# Patient Record
Sex: Female | Born: 1985 | Race: White | Hispanic: No | Marital: Single | Smoking: Current every day smoker
Health system: Southern US, Community
[De-identification: ages and names within clinical notes are randomized; demographics above are authoritative.]

---

## 2006-07-31 ENCOUNTER — Ambulatory Visit (HOSPITAL_COMMUNITY): Admission: RE | Admit: 2006-07-31 | Discharge: 2006-07-31 | Payer: Self-pay | Admitting: Obstetrics and Gynecology

## 2006-08-21 ENCOUNTER — Ambulatory Visit (HOSPITAL_COMMUNITY): Admission: RE | Admit: 2006-08-21 | Discharge: 2006-08-21 | Payer: Self-pay | Admitting: Obstetrics and Gynecology

## 2011-04-18 ENCOUNTER — Emergency Department (INDEPENDENT_AMBULATORY_CARE_PROVIDER_SITE_OTHER): Payer: Self-pay

## 2011-04-18 ENCOUNTER — Emergency Department (HOSPITAL_BASED_OUTPATIENT_CLINIC_OR_DEPARTMENT_OTHER)
Admission: EM | Admit: 2011-04-18 | Discharge: 2011-04-18 | Disposition: A | Discharge status: Home / Self Care | Payer: Self-pay | Attending: Emergency Medicine | Admitting: Emergency Medicine

## 2011-04-18 ENCOUNTER — Encounter: Payer: Self-pay | Admitting: Family Medicine

## 2011-04-18 DIAGNOSIS — O2690 Pregnancy related conditions, unspecified, unspecified trimester: Secondary | ICD-10-CM | POA: Insufficient documentation

## 2011-04-18 DIAGNOSIS — X500XXA Overexertion from strenuous movement or load, initial encounter: Secondary | ICD-10-CM

## 2011-04-18 DIAGNOSIS — F17200 Nicotine dependence, unspecified, uncomplicated: Secondary | ICD-10-CM | POA: Insufficient documentation

## 2011-04-18 DIAGNOSIS — M25579 Pain in unspecified ankle and joints of unspecified foot: Secondary | ICD-10-CM

## 2011-04-18 DIAGNOSIS — R609 Edema, unspecified: Secondary | ICD-10-CM

## 2011-04-18 MED ORDER — ACETAMINOPHEN 325 MG PO TABS
650.0000 mg | ORAL_TABLET | Freq: Once | ORAL | Status: AC
  Administered 2011-04-18: 650 mg via ORAL
  Filled 2011-04-18: qty 2
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
## 2011-04-18 NOTE — ED Notes (Signed)
Report given to Laura, RN.

## 2011-04-18 NOTE — ED Notes (Signed)
Pt sts she is pregnant but unsure if she is "2 or 3 months". Pt sts she has 4 kids and this is 5th pregnancy.
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
## 2014-06-23 ENCOUNTER — Encounter: Payer: Self-pay | Admitting: Family Medicine

## 2016-11-08 DIAGNOSIS — O99322 Drug use complicating pregnancy, second trimester: Secondary | ICD-10-CM | POA: Diagnosis not present

## 2016-11-08 DIAGNOSIS — R45851 Suicidal ideations: Secondary | ICD-10-CM | POA: Diagnosis not present

## 2016-11-08 DIAGNOSIS — F17210 Nicotine dependence, cigarettes, uncomplicated: Secondary | ICD-10-CM | POA: Diagnosis not present

## 2016-11-08 DIAGNOSIS — Y92410 Unspecified street and highway as the place of occurrence of the external cause: Secondary | ICD-10-CM | POA: Insufficient documentation

## 2016-11-08 DIAGNOSIS — Y9389 Activity, other specified: Secondary | ICD-10-CM | POA: Insufficient documentation

## 2016-11-08 DIAGNOSIS — F1110 Opioid abuse, uncomplicated: Secondary | ICD-10-CM | POA: Insufficient documentation

## 2016-11-08 DIAGNOSIS — Y999 Unspecified external cause status: Secondary | ICD-10-CM | POA: Insufficient documentation

## 2016-11-08 DIAGNOSIS — S8251XA Displaced fracture of medial malleolus of right tibia, initial encounter for closed fracture: Secondary | ICD-10-CM | POA: Diagnosis not present

## 2016-11-08 DIAGNOSIS — Z3A20 20 weeks gestation of pregnancy: Secondary | ICD-10-CM | POA: Diagnosis not present

## 2016-11-08 DIAGNOSIS — O9A212 Injury, poisoning and certain other consequences of external causes complicating pregnancy, second trimester: Secondary | ICD-10-CM | POA: Diagnosis present

## 2016-11-08 DIAGNOSIS — Z79899 Other long term (current) drug therapy: Secondary | ICD-10-CM | POA: Insufficient documentation

## 2016-11-08 DIAGNOSIS — O99332 Smoking (tobacco) complicating pregnancy, second trimester: Secondary | ICD-10-CM | POA: Diagnosis not present

## 2016-11-08 DIAGNOSIS — O99342 Other mental disorders complicating pregnancy, second trimester: Secondary | ICD-10-CM | POA: Diagnosis not present

## 2016-11-09 ENCOUNTER — Encounter (HOSPITAL_COMMUNITY): Payer: Self-pay | Admitting: Emergency Medicine

## 2016-11-09 ENCOUNTER — Emergency Department (HOSPITAL_COMMUNITY): Payer: Medicaid Other

## 2016-11-09 ENCOUNTER — Emergency Department (HOSPITAL_COMMUNITY)
Admission: EM | Admit: 2016-11-09 | Discharge: 2016-11-10 | Disposition: A | Discharge status: Home / Self Care | Payer: Medicaid Other | Attending: Emergency Medicine | Admitting: Emergency Medicine

## 2016-11-09 DIAGNOSIS — R45851 Suicidal ideations: Secondary | ICD-10-CM

## 2016-11-09 DIAGNOSIS — S82891A Other fracture of right lower leg, initial encounter for closed fracture: Secondary | ICD-10-CM

## 2016-11-09 DIAGNOSIS — F1910 Other psychoactive substance abuse, uncomplicated: Secondary | ICD-10-CM

## 2016-11-09 DIAGNOSIS — Z3490 Encounter for supervision of normal pregnancy, unspecified, unspecified trimester: Secondary | ICD-10-CM

## 2016-11-09 LAB — COMPREHENSIVE METABOLIC PANEL
ALBUMIN: 2.9 g/dL — AB (ref 3.5–5.0)
ALK PHOS: 97 U/L (ref 38–126)
ALT: 18 U/L (ref 14–54)
AST: 22 U/L (ref 15–41)
Anion gap: 12 (ref 5–15)
BUN: 6 mg/dL (ref 6–20)
CALCIUM: 9.1 mg/dL (ref 8.9–10.3)
CO2: 21 mmol/L — AB (ref 22–32)
CREATININE: 0.55 mg/dL (ref 0.44–1.00)
Chloride: 104 mmol/L (ref 101–111)
GFR calc Af Amer: >60 mL/min (ref >60)
GFR calc non Af Amer: >60 mL/min (ref >60)
GLUCOSE: 114 mg/dL — AB (ref 65–99)
Potassium: 3.6 mmol/L (ref 3.5–5.1)
SODIUM: 137 mmol/L (ref 135–145)
Total Bilirubin: 0.5 mg/dL (ref 0.3–1.2)
Total Protein: 6.3 g/dL — ABNORMAL LOW (ref 6.5–8.1)

## 2016-11-09 LAB — ETHANOL: Alcohol, Ethyl (B): <5 mg/dL (ref <5)

## 2016-11-09 LAB — RAPID URINE DRUG SCREEN, HOSP PERFORMED
Amphetamines: NOT DETECTED
BARBITURATES: NOT DETECTED
Benzodiazepines: NOT DETECTED
COCAINE: NOT DETECTED
Opiates: NOT DETECTED
TETRAHYDROCANNABINOL: POSITIVE — AB

## 2016-11-09 LAB — CBC
HEMATOCRIT: 36.2 % (ref 36.0–46.0)
Hemoglobin: 12.6 g/dL (ref 12.0–15.0)
MCH: 31.9 pg (ref 26.0–34.0)
MCHC: 34.8 g/dL (ref 30.0–36.0)
MCV: 91.6 fL (ref 78.0–100.0)
PLATELETS: 246 10*3/uL (ref 150–400)
RBC: 3.95 MIL/uL (ref 3.87–5.11)
RDW: 14.3 % (ref 11.5–15.5)
WBC: 11.8 10*3/uL — ABNORMAL HIGH (ref 4.0–10.5)

## 2016-11-09 LAB — ACETAMINOPHEN LEVEL: Acetaminophen (Tylenol), Serum: <10 ug/mL — ABNORMAL LOW (ref 10–30)

## 2016-11-09 LAB — SALICYLATE LEVEL

## 2016-11-09 MED ORDER — NICOTINE 21 MG/24HR TD PT24: 21.0000 mg | MEDICATED_PATCH | Freq: Once | TRANSDERMAL | Status: DC

## 2016-11-09 MED ORDER — PRENATAL 27-0.8 MG PO TABS
1.0000 | ORAL_TABLET | Freq: Every day | ORAL | Status: DC
  Administered 2016-11-09: 1 via ORAL
  Filled 2016-11-09 (×3): qty 1

## 2016-11-09 MED ORDER — HYDROXYZINE PAMOATE 50 MG PO CAPS: 100.0000 mg | ORAL_CAPSULE | Freq: Three times a day (TID) | ORAL | Status: DC | PRN

## 2016-11-09 MED ORDER — ACETAMINOPHEN 325 MG PO TABS
650.0000 mg | ORAL_TABLET | ORAL | Status: DC | PRN
  Administered 2016-11-09 – 2016-11-10 (×2): 650 mg via ORAL
  Filled 2016-11-09 (×2): qty 2

## 2016-11-09 MED ORDER — ACETAMINOPHEN 500 MG PO TABS: 1000.0000 mg | ORAL_TABLET | Freq: Four times a day (QID) | ORAL | Status: DC | PRN

## 2016-11-09 MED ORDER — NICOTINE 21 MG/24HR TD PT24
21.0000 mg | MEDICATED_PATCH | Freq: Every day | TRANSDERMAL | Status: DC
  Filled 2016-11-09: qty 1
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
## 2016-11-09 NOTE — Progress Notes (Signed)
Orthopedic Tech Progress Note Patient Details:  Meredith Miller [birth year] [id number]  Ortho Devices Type of Ortho Device: Ace wrap, Crutches, Post (short leg) splint, Stirrup splint Ortho Device/Splint Location: rle Ortho Device/Splint Interventions: Application   Jaquese Irving 11/09/2016, 9:25 AM

## 2016-11-09 NOTE — ED Notes (Signed)
Clean linen placed on bed and warm blanket given
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
## 2016-11-09 NOTE — ED Notes (Signed)
Pt requested saltines and sprite for snack. Pt given the same

## 2016-11-09 NOTE — Progress Notes (Signed)
CSW contacted the [place] [employer] in order to get information on residential perinatal substance abuse treatment programs for patient.  CSW spoke to Dolores Pattyeana Roberts and left a message for Lindell NoeJudith Johnson-Hostler, who is the Perinatal Substance Abuse Specialist for the state.   CSW contacted [facility] CSW to make her aware of efforts to contact perinatal SA programs and to ask her to inquire if pt is only interested in the programs near [place] as she had indicated earlier that she was interested in going to the  [facility], which does not treat perinatal SA.    [facility] CSW spoke with pt and relayed to this writer that patient wanted only "30 days" of treatment.  Perinatal residential programs are 1-2 years in duration.  CS[facility], and [facility].  Timmothy EulerJean T. Kaylyn LimSutter, MSW, LCSWA Clinical Social Work Disposition [phone]

## 2016-11-09 NOTE — ED Provider Notes (Signed)
A shot resting in bed pleasant cooperative alert and in no distress. Requesting nicotine patch however I explained to her that she is pregnant and there is a risk to the fetus with nicotine patch. She is in agreement not to forego nicotine patch presently   Doug SouSam Ziad Maye, MD 11/09/16 1454
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
## 2016-11-09 NOTE — ED Notes (Signed)
Due to pt having a cast on her ankle she is given a wash basin to wash off in her room. She is given a fresh change of scrubs and socks. Shampoo, tooth brush, tooth paste, antiperspirant and underware

## 2016-11-09 NOTE — BH Assessment (Signed)
Tele Assessment Note   Meredith Miller is an 31 y.o. female. Pt reports SI with a plan. Pt denies HI and AVH. Pt states she is 5 months pregnant. Pt reports ongoing drug use. Pt states she is using opiates, marijuana, and cocaine daily. Pt reports previous hospitalizations for depression and SI. Pt states she was recently D/C from [facility] for depression and SI. Pt states she was suppose to follow-up with [facility] but she has not had a chance. Pt states she does not feel comfortable returning home because of her drug use. Pt states she would like to enter into a drug rehabilitation program. Pt denies abuse.   Per Jacki Cones, NP Pt meets inpatient criteria. TTS to seek placement.   Diagnosis:  F33.2 MDD  Past Medical History: History reviewed. No pertinent past medical history.  History reviewed. No pertinent surgical history.  Family History: History reviewed. No pertinent family history.  Social History:  reports that she has been smoking Cigarettes.  She has been smoking about 0.50 packs per day. She does not have any smokeless tobacco history on file. She reports that she uses drugs, including Marijuana. She reports that she does not drink alcohol.  Additional Social History:  Alcohol / Drug Use Pain Medications: please see mar Prescriptions: please see mar Over the Counter: please see mar History of alcohol / drug use?: Yes Longest period of sobriety (when/how long): unknown Substance #1 Name of Substance 1: percocet/heroin 1 - Age of First Use: unknown 1 - Amount (size/oz): $20-$40 1 - Frequency: daily 1 - Duration: ongoing 1 - Last Use / Amount: 11/07/16 Substance #2 Name of Substance 2: marijuana 2 - Age of First Use: unknown 2 - Amount (size/oz): 2 blunts 2 - Frequency: daily 2 - Duration: ongoing 2 - Last Use / Amount: 11/07/16 Substance #3 Name of Substance 3: cocaine 3 - Age of First Use: unknown 3 - Amount (size/oz): $20 3 - Frequency: occasional 3 - Duration:  ongoing 3 - Last Use / Amount: 2 weeks ago  CIWA: CIWA-Ar BP: 116/75 Pulse Rate: 77 COWS:    PATIENT STRENGTHS: (choose at least two) Average or above average intelligence Communication skills  Allergies:  Allergies  Allergen Reactions  . Penicillins     Doesn't work    Home Medications:  (Not in a hospital admission)  OB/GYN Status:  No LMP recorded. Patient is pregnant.  General Assessment Data Location of Assessment: [facility] ED TTS Assessment: In system Is this a Tele or Face-to-Face Assessment?: Face-to-Face Is this an Initial Assessment or a Re-assessment for this encounter?: Initial Assessment Marital status: Single Maiden name: NA Is patient pregnant?: Yes Pregnancy Status: Yes (Comment: include estimated delivery date) (5 months ) Living Arrangements: Spouse/significant other Can pt return to current living arrangement?: Yes Admission Status: Voluntary Is patient capable of signing voluntary admission?: Yes Referral Source: Self/Family/Friend Insurance type: Medicaid     Crisis Care Plan Living Arrangements: Spouse/significant other Legal Guardian: Other: (self) Name of Psychiatrist: [facility] of Therapist: [facility]  Education Status Is patient currently in school?: No Current Grade: NA Highest grade of school patient has completed: 12 Name of school: NA Contact person: NA  Risk to self with the past 6 months Suicidal Ideation: No-Not Currently/Within Last 6 Months Has patient been a risk to self within the past 6 months prior to admission? : No Suicidal Intent: No-Not Currently/Within Last 6 Months Has patient had any suicidal intent within the past 6 months prior to admission? :  No Is patient at risk for suicide?: No Suicidal Plan?: No Has patient had any suicidal plan within the past 6 months prior to admission? : No Access to Means: No What has been your use of drugs/alcohol within the last 12 months?: opiate, marijuana, cocaine Previous  Attempts/Gestures: No How many times?: 0 Other Self Harm Risks: NA Triggers for Past Attempts: None known Intentional Self Injurious Behavior: None Family Suicide History: No Recent stressful life event(s): Other (Comment) Persecutory voices/beliefs?: No Depression: Yes Depression Symptoms: Despondent, Tearfulness, Isolating, Fatigue, Loss of interest in usual pleasures, Feeling worthless/self pity, Feeling angry/irritable Substance abuse history and/or treatment for substance abuse?: Yes Suicide prevention information given to non-admitted patients: Not applicable  Risk to Others within the past 6 months Homicidal Ideation: No Does patient have any lifetime risk of violence toward others beyond the six months prior to admission? : No Thoughts of Harm to Others: No Current Homicidal Intent: No Current Homicidal Plan: No Access to Homicidal Means: No Identified Victim: NA History of harm to others?: No Assessment of Violence: None Noted Violent Behavior Description: NA Does patient have access to weapons?: No Criminal Charges Pending?: No Does patient have a court date: No Is patient on probation?: No  Psychosis Hallucinations: None noted Delusions: None noted  Mental Status Report Appearance/Hygiene: Unremarkable Eye Contact: Fair Motor Activity: Freedom of movement Speech: Logical/coherent Level of Consciousness: Alert Mood: Depressed Affect: Depressed Anxiety Level: Moderate Thought Processes: Coherent, Relevant Judgement: Unimpaired Orientation: Person, Place, Time, Situation, Appropriate for developmental age Obsessive Compulsive Thoughts/Behaviors: None  Cognitive Functioning Concentration: Normal Memory: Recent Intact, Remote Intact IQ: Average Insight: Fair Impulse Control: Fair Appetite: Fair Weight Loss: 0 Weight Gain: 0 Sleep: Decreased Total Hours of Sleep: 5 Vegetative Symptoms: None  ADLScreening [facility] Assessment Services) Patient's cognitive  ability adequate to safely complete daily activities?: Yes Patient able to express need for assistance with ADLs?: Yes Independently performs ADLs?: Yes (appropriate for developmental age)  Prior Inpatient Therapy Prior Inpatient Therapy: Yes Prior Therapy Dates: 2018 Prior Therapy Facilty/Provider(s): [facility] Reason for Treatment: depression, SI  Prior Outpatient Therapy Prior Outpatient Therapy: No Prior Therapy Dates: NA Prior Therapy Facilty/Provider(s): nA Reason for Treatment: nA Does patient have an ACCT team?: No Does patient have Intensive In-House Services?  : No Does patient have [facility] services? : No Does patient have P4CC services?: No  ADL Screening (condition at time of admission) Patient's cognitive ability adequate to safely complete daily activities?: Yes Is the patient deaf or have difficulty hearing?: No Does the patient have difficulty seeing, even when wearing glasses/contacts?: No Does the patient have difficulty concentrating, remembering, or making decisions?: No Patient able to express need for assistance with ADLs?: Yes Does the patient have difficulty dressing or bathing?: No Independently performs ADLs?: Yes (appropriate for developmental age) Does the patient have difficulty walking or climbing stairs?: No Weakness of Legs: None Weakness of Arms/Hands: None       Abuse/Neglect Assessment (Assessment to be complete while patient is alone) Physical Abuse: Denies Verbal Abuse: Denies Sexual Abuse: Denies Exploitation of patient/patient's resources: Denies Self-Neglect: Denies     Merchant navy officerAdvance Directives (For Healthcare) Does Patient Have a Medical Advance Directive?: No    Additional Information 1:1 In Past 12 Months?: No CIRT Risk: No Elopement Risk: No Does patient have medical clearance?: Yes     Disposition:  Disposition Initial Assessment Completed for this Encounter: Yes  Keltin Baird D 11/09/2016 10:56 AM

## 2016-11-10 LAB — POC URINE PREG, ED: Preg Test, Ur: POSITIVE — AB

## 2016-11-10 MED ORDER — PRENATAL MULTIVITAMIN CH
1.0000 | ORAL_TABLET | Freq: Every day | ORAL | Status: DC
  Administered 2016-11-10: 1 via ORAL

## 2016-11-10 NOTE — BH Assessment (Signed)
Re-Assessment   Patient is a 31 year old white female that denies SI/HI/Psychosis.  Patient reports a past histoy of substance abuse.  Patient is 5 months pregnant.   Patient reports the when she came to the ED she may have made some, "statements that were not accurate".   Patient states that she was feeling emotional due to the pregnancy.   Patient reports that [facility] has [facility] and she is able to go to their walk in clinic today to have her psychiatric medication adjusted    Per Jacki ConesLaurie, NP - patient does not meet criteria for inpatient hospitalization.  Writer informed the ER RN.

## 2016-11-10 NOTE — ED Notes (Signed)
A Regular Diet has Been Ordered for [employer].

## 2016-11-10 NOTE — ED Notes (Signed)
Patient was given a snack and drink, and A Regular Diet was taken for Lunch. 
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
## 2018-03-09 ENCOUNTER — Other Ambulatory Visit: Payer: Self-pay

## 2018-03-09 ENCOUNTER — Encounter (HOSPITAL_COMMUNITY): Payer: Self-pay | Admitting: Emergency Medicine

## 2018-03-09 ENCOUNTER — Emergency Department (HOSPITAL_COMMUNITY)
Admission: EM | Admit: 2018-03-09 | Discharge: 2018-03-09 | Disposition: A | Discharge status: Home / Self Care | Payer: Medicaid Other | Attending: Emergency Medicine | Admitting: Emergency Medicine

## 2018-03-09 DIAGNOSIS — F418 Other specified anxiety disorders: Secondary | ICD-10-CM | POA: Diagnosis present

## 2018-03-09 DIAGNOSIS — Z5321 Procedure and treatment not carried out due to patient leaving prior to being seen by health care provider: Secondary | ICD-10-CM | POA: Insufficient documentation

## 2018-03-09 LAB — RAPID URINE DRUG SCREEN, HOSP PERFORMED
AMPHETAMINES: POSITIVE — AB
Benzodiazepines: NOT DETECTED
Cocaine: POSITIVE — AB
OPIATES: POSITIVE — AB
TETRAHYDROCANNABINOL: POSITIVE — AB

## 2018-03-09 LAB — COMPREHENSIVE METABOLIC PANEL
ALK PHOS: 74 U/L (ref 38–126)
ALT: 31 U/L (ref 0–44)
AST: 30 U/L (ref 15–41)
Albumin: 3.8 g/dL (ref 3.5–5.0)
Anion gap: 10 (ref 5–15)
BILIRUBIN TOTAL: 0.5 mg/dL (ref 0.3–1.2)
BUN: 8 mg/dL (ref 6–20)
CALCIUM: 9.4 mg/dL (ref 8.9–10.3)
CO2: 25 mmol/L (ref 22–32)
Chloride: 107 mmol/L (ref 98–111)
Creatinine, Ser: 1.01 mg/dL — ABNORMAL HIGH (ref 0.44–1.00)
GFR calc Af Amer: >60 mL/min (ref >60)
GFR calc non Af Amer: >60 mL/min (ref >60)
GLUCOSE: 112 mg/dL — AB (ref 70–99)
POTASSIUM: 3.7 mmol/L (ref 3.5–5.1)
SODIUM: 142 mmol/L (ref 135–145)
TOTAL PROTEIN: 6.7 g/dL (ref 6.5–8.1)

## 2018-03-09 LAB — CBC
HCT: 42.7 % (ref 36.0–46.0)
HEMOGLOBIN: 14.1 g/dL (ref 12.0–15.0)
MCH: 29.9 pg (ref 26.0–34.0)
MCHC: 33 g/dL (ref 30.0–36.0)
MCV: 90.7 fL (ref 78.0–100.0)
Platelets: 212 10*3/uL (ref 150–400)
RBC: 4.71 MIL/uL (ref 3.87–5.11)
RDW: 12.9 % (ref 11.5–15.5)
WBC: 8.9 10*3/uL (ref 4.0–10.5)

## 2018-03-09 LAB — I-STAT BETA HCG BLOOD, ED (MC, WL, AP ONLY)

## 2018-03-09 LAB — ETHANOL: Alcohol, Ethyl (B): <10 mg/dL (ref <10)

## 2018-03-09 NOTE — ED Triage Notes (Signed)
Pt reports anxiety and depression ongoing for several weeks. Reports no SI/HI at this time. Reports insomnia, agitation, mood swings.

## 2018-03-09 NOTE — ED Notes (Signed)
Pt did not respond when called for vitals 1X 

## 2018-03-09 NOTE — ED Notes (Signed)
Pt did not respond when called for room 2X

## 2018-03-23 ENCOUNTER — Other Ambulatory Visit: Payer: Self-pay

## 2018-03-23 ENCOUNTER — Emergency Department (HOSPITAL_COMMUNITY)
Admission: EM | Admit: 2018-03-23 | Discharge: 2018-03-24 | Disposition: A | Discharge status: Home / Self Care | Payer: Medicaid Other | Attending: Emergency Medicine | Admitting: Emergency Medicine

## 2018-03-23 ENCOUNTER — Encounter (HOSPITAL_COMMUNITY): Payer: Self-pay | Admitting: *Deleted

## 2018-03-23 ENCOUNTER — Ambulatory Visit (HOSPITAL_COMMUNITY)
Admission: RE | Admit: 2018-03-23 | Discharge: 2018-03-23 | Disposition: A | Discharge status: Home / Self Care | Payer: Medicaid Other | Attending: Psychiatry | Admitting: Psychiatry

## 2018-03-23 DIAGNOSIS — F1490 Cocaine use, unspecified, uncomplicated: Secondary | ICD-10-CM | POA: Diagnosis not present

## 2018-03-23 DIAGNOSIS — F17210 Nicotine dependence, cigarettes, uncomplicated: Secondary | ICD-10-CM | POA: Insufficient documentation

## 2018-03-23 DIAGNOSIS — F1420 Cocaine dependence, uncomplicated: Secondary | ICD-10-CM | POA: Insufficient documentation

## 2018-03-23 DIAGNOSIS — F32A Depression, unspecified: Secondary | ICD-10-CM

## 2018-03-23 DIAGNOSIS — Z79899 Other long term (current) drug therapy: Secondary | ICD-10-CM | POA: Insufficient documentation

## 2018-03-23 DIAGNOSIS — Z915 Personal history of self-harm: Secondary | ICD-10-CM | POA: Diagnosis not present

## 2018-03-23 DIAGNOSIS — R110 Nausea: Secondary | ICD-10-CM | POA: Diagnosis present

## 2018-03-23 DIAGNOSIS — F4310 Post-traumatic stress disorder, unspecified: Secondary | ICD-10-CM | POA: Diagnosis not present

## 2018-03-23 DIAGNOSIS — F1910 Other psychoactive substance abuse, uncomplicated: Secondary | ICD-10-CM | POA: Insufficient documentation

## 2018-03-23 DIAGNOSIS — F329 Major depressive disorder, single episode, unspecified: Secondary | ICD-10-CM | POA: Diagnosis not present

## 2018-03-23 DIAGNOSIS — F1120 Opioid dependence, uncomplicated: Secondary | ICD-10-CM | POA: Insufficient documentation

## 2018-03-23 LAB — I-STAT BETA HCG BLOOD, ED (MC, WL, AP ONLY): I-stat hCG, quantitative: <5 m[IU]/mL (ref <5)

## 2018-03-23 NOTE — ED Triage Notes (Signed)
The pt last had heroin and alcohol yesterday
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
## 2018-03-23 NOTE — ED Triage Notes (Signed)
The pt was brought here by behavorial personnell from there the pt wants to be detoxed from heroin cocaine and alcoho  lmp depo

## 2018-03-23 NOTE — ED Notes (Signed)
Pt unable to void at this time. 

## 2018-03-24 DIAGNOSIS — F1290 Cannabis use, unspecified, uncomplicated: Secondary | ICD-10-CM

## 2018-03-24 DIAGNOSIS — F17210 Nicotine dependence, cigarettes, uncomplicated: Secondary | ICD-10-CM

## 2018-03-24 DIAGNOSIS — F329 Major depressive disorder, single episode, unspecified: Secondary | ICD-10-CM | POA: Insufficient documentation

## 2018-03-24 DIAGNOSIS — F1910 Other psychoactive substance abuse, uncomplicated: Secondary | ICD-10-CM

## 2018-03-24 DIAGNOSIS — F419 Anxiety disorder, unspecified: Secondary | ICD-10-CM

## 2018-03-24 DIAGNOSIS — F1490 Cocaine use, unspecified, uncomplicated: Secondary | ICD-10-CM

## 2018-03-24 DIAGNOSIS — F32A Depression, unspecified: Secondary | ICD-10-CM | POA: Insufficient documentation

## 2018-03-24 LAB — COMPREHENSIVE METABOLIC PANEL
ALT: 28 U/L (ref 0–44)
AST: 23 U/L (ref 15–41)
Albumin: 3.7 g/dL (ref 3.5–5.0)
Alkaline Phosphatase: 71 U/L (ref 38–126)
Anion gap: 11 (ref 5–15)
BUN: 8 mg/dL (ref 6–20)
CO2: 23 mmol/L (ref 22–32)
Calcium: 9.3 mg/dL (ref 8.9–10.3)
Chloride: 108 mmol/L (ref 98–111)
Creatinine, Ser: 0.83 mg/dL (ref 0.44–1.00)
GFR calc Af Amer: >60 mL/min (ref >60)
GFR calc non Af Amer: >60 mL/min (ref >60)
Glucose, Bld: 87 mg/dL (ref 70–99)
Potassium: 3.4 mmol/L — ABNORMAL LOW (ref 3.5–5.1)
Sodium: 142 mmol/L (ref 135–145)
Total Bilirubin: 0.6 mg/dL (ref 0.3–1.2)
Total Protein: 7 g/dL (ref 6.5–8.1)

## 2018-03-24 LAB — CBC
HCT: 43.3 % (ref 36.0–46.0)
Hemoglobin: 14.1 g/dL (ref 12.0–15.0)
MCH: 29.6 pg (ref 26.0–34.0)
MCHC: 32.6 g/dL (ref 30.0–36.0)
MCV: 91 fL (ref 78.0–100.0)
Platelets: 224 10*3/uL (ref 150–400)
RBC: 4.76 MIL/uL (ref 3.87–5.11)
RDW: 13.1 % (ref 11.5–15.5)
WBC: 8.6 10*3/uL (ref 4.0–10.5)

## 2018-03-24 LAB — ETHANOL: Alcohol, Ethyl (B): <10 mg/dL (ref <10)

## 2018-03-24 MED ORDER — PRENATAL MULTIVITAMIN CH
1.0000 | ORAL_TABLET | Freq: Every day | ORAL | Status: DC
  Filled 2018-03-24: qty 1

## 2018-03-24 MED ORDER — CLONIDINE HCL 0.2 MG PO TABS
0.1000 mg | ORAL_TABLET | Freq: Once | ORAL | Status: AC
  Administered 2018-03-24: 0.1 mg via ORAL
  Filled 2018-03-24: qty 1

## 2018-03-24 MED ORDER — ACETAMINOPHEN 500 MG PO TABS: 1000.0000 mg | ORAL_TABLET | Freq: Four times a day (QID) | ORAL | Status: DC | PRN

## 2018-03-24 MED ORDER — CITALOPRAM HYDROBROMIDE 10 MG PO TABS
20.0000 mg | ORAL_TABLET | Freq: Every day | ORAL | Status: DC
  Filled 2018-03-24: qty 2

## 2018-03-24 MED ORDER — ONDANSETRON HCL 4 MG PO TABS: 4.0000 mg | ORAL_TABLET | Freq: Four times a day (QID) | ORAL | 0 refills | Status: AC | PRN

## 2018-03-24 MED ORDER — DICYCLOMINE HCL 20 MG PO TABS: 20.0000 mg | ORAL_TABLET | Freq: Four times a day (QID) | ORAL | 0 refills | Status: AC | PRN

## 2018-03-24 MED ORDER — NAPROXEN 250 MG PO TABS
250.0000 mg | ORAL_TABLET | Freq: Once | ORAL | Status: AC
  Administered 2018-03-24: 250 mg via ORAL
  Filled 2018-03-24: qty 1

## 2018-03-24 MED ORDER — NAPROXEN 375 MG PO TABS: 375.0000 mg | ORAL_TABLET | Freq: Two times a day (BID) | ORAL | 0 refills | Status: AC | PRN

## 2018-03-24 MED ORDER — DICYCLOMINE HCL 10 MG PO CAPS
10.0000 mg | ORAL_CAPSULE | Freq: Once | ORAL | Status: AC
  Administered 2018-03-24: 10 mg via ORAL
  Filled 2018-03-24: qty 1

## 2018-03-24 MED ORDER — ONDANSETRON 4 MG PO TBDP
4.0000 mg | ORAL_TABLET | Freq: Once | ORAL | Status: AC
  Administered 2018-03-24: 4 mg via ORAL
  Filled 2018-03-24: qty 1

## 2018-03-24 MED ORDER — HYDROXYZINE PAMOATE 50 MG PO CAPS
100.0000 mg | ORAL_CAPSULE | Freq: Three times a day (TID) | ORAL | Status: DC | PRN
  Filled 2018-03-24: qty 2

## 2018-03-24 NOTE — ED Notes (Signed)
Belongings inventoried by Neldon LabellaEmmy, Charity fundraiserN and placed in locker #13

## 2018-03-24 NOTE — ED Notes (Addendum)
This nurse walks in with discharge paperwork and PT states, "I'm not going home, I'm supposed to be admitted for detox." EDP reports PT does not meet criteria for need for IP detox at this time. Psych provider is at nurses station and will round on PT to resolve confusion.

## 2018-03-24 NOTE — ED Notes (Signed)
Went into pt room and verbally reminded her that she has a urine specimen to be collected as soon as she is able. Spoke with tech sitting for room 2 and asked her to watch incase pt in 1 got up to use the restroom to please get me so that I can be sure she gets the specimen, she agrees she will be sure she lets me know

## 2018-03-24 NOTE — H&P (Signed)
Behavioral Health Medical Screening Exam  Meredith Miller is an 32 y.o. female.Rhegan Miller is an 32 y.o. female.  The pt came in to detox from cocaine, heroin, and alcohol.  Her last usage was yesterday.  The pt stated she is going to [facility] and continues to have positive results for benzodiazepines.  She reports she is not taking a benzo drug.  Her longest period of sobriety was a year and a half while on Suboxone and pregnant.  The pt endorses SI.  She reported she has a history of cutting herself and last cut herself in 2013.  The pt has had several inpatient hospitalizations in the past.  Her most recent hospitalization was 02/2018 at [facility].  [facility] also [facility].    Total Time spent with patient: 20 minutes  Psychiatric Specialty Exam: Physical Exam  Constitutional: She is oriented to person, place, and time. She appears well-developed and well-nourished.  HENT:  Head: Normocephalic.  Eyes: Pupils are equal, round, and reactive to light.  Neck: Normal range of motion. Neck supple.  Respiratory: Effort normal and breath sounds normal. No respiratory distress.  GI: Soft. Bowel sounds are normal. She exhibits distension.  Neurological: She is alert and oriented to person, place, and time.  Skin: Skin is warm and dry. She is not diaphoretic.  Psychiatric: Her speech is normal. Her mood appears anxious. She is agitated and withdrawn. Cognition and memory are impaired. She expresses impulsivity. She exhibits a depressed mood. She expresses suicidal ideation.    Review of Systems  Constitutional: Negative for chills, diaphoresis, fever, malaise/fatigue and weight loss.  Psychiatric/Behavioral: Positive for depression, substance abuse and suicidal ideas. Negative for memory loss. The patient is nervous/anxious and has insomnia.   All other systems reviewed and are negative.   unknown if currently breastfeeding.There is no height or weight on file to calculate BMI.   General Appearance: Disheveled  Eye Contact:  Poor  Speech:  Blocked  Volume:  Decreased  Mood:  Depressed  Affect:  Congruent  Thought Process:  Goal Directed  Orientation:  Full (Time, Place, and Person)  Thought Content:  Logical  Suicidal Thoughts:  Yes.  without intent/plan  Homicidal Thoughts:  No  Memory:  Immediate;   Poor  Judgement:  Impaired  Insight:  Lacking  Psychomotor Activity:  Normal  Concentration: Concentration: Poor  Recall:  Poor  Fund of Knowledge:Poor  Language: Fair  Akathisia:  Negative  Handed:  Right  AIMS (if indicated):     Assets:  Desire for Improvement  Sleep:       Musculoskeletal: Strength & Muscle Tone: within normal limits Gait & Station: normal Patient leans: N/A  unknown if currently breastfeeding.  Recommendations:  Based on my evaluation the patient appears to have an emergency medical condition for which I recommend the patient be transferred to the emergency department for further evaluation.  Kerry HoughSpencer E Makiyah Zentz, PA-C 03/24/2018, 4:42 AM

## 2018-03-24 NOTE — ED Notes (Addendum)
PER laurie, behavorial medicine NP , ok to discharge pt at this time , ok to discharge without peer support coming and seeing her , pt will be given resources with her discharge paper work

## 2018-03-24 NOTE — Consult Note (Addendum)
[facility] Psych ED Discharge  03/24/2018 9:57 AM Meredith Miller  MRN:  [id number] Principal Problem: Polysubstance abuse [facility]) Discharge Diagnoses:  Patient Active Problem List   Diagnosis Date Noted  . Depression [F32.9]   . Polysubstance abuse (HCC) [F19.10]     Subjective: Pt was seen and chart reviewed with treatment team and Dr Jannifer Franklin. Pt denies suicidal/homicidal ideation, denies auditory/visual hallucinations and does not appear to be responding to internal stimuli. Pt stated she has been to [facility] in July for detox but left because she didn't like it there. Pt is with [facility] and can go there for treatment and get her methadone. Pt has also been to [facility] and [facility]. Pt has a history of polysubstance abuse and in July prior to going to [facility] her UDS was positive for benzos, amphetamines, cocaine, and opiates. Pt's UDS is still pending, BAL negative today, Pt wants to use the phone to contact [facility] so she can leave and go to treatment today. Pt's lab work is WNL, POCT indicates Pt is not pregnant. No other diagnostic test were ordered during this admission. Order has been placed for Peer Support to assist with community substance abuse treatment programs. Pt is stable and psychiatrically clear for discharge.   Total Time spent with patient: 45 minutes  Past Psychiatric History: As above  Past Medical History: History reviewed. No pertinent past medical history. History reviewed. No pertinent surgical history. Family History: No family history on file. Family Psychiatric  History: Unknown Social History:  Social History   Substance and Sexual Activity  Alcohol Use Yes    Social History   Substance and Sexual Activity  Drug Use Yes  . Types: Marijuana, Cocaine   Comment: opiates (last use 4-5 days ago 11/09/16)   Social History   Socioeconomic History  . Marital status: Single    Spouse name: Not on file  . Number of children: Not on file  . Years of education: Not on file  .  Highest education level: Not on file  Occupational History  . Not on file  Social Needs  . Financial resource strain: Not on file  . Food insecurity:    Worry: Not on file    Inability: Not on file  . Transportation needs:    Medical: Not on file    Non-medical: Not on file  Tobacco Use  . Smoking status: Current Every Day Smoker    Packs/day: 1.00    Types: Cigarettes  . Smokeless tobacco: Never Used  Substance and Sexual Activity  . Alcohol use: Yes  . Drug use: Yes    Types: Marijuana, Cocaine    Comment: opiates (last use 4-5 days ago 11/09/16)  . Sexual activity: Yes    Birth control/protection: None  Lifestyle  . Physical activity:    Days per week: Not on file    Minutes per session: Not on file  . Stress: Not on file  Relationships  . Social connections:    Talks on phone: Not on file    Gets together: Not on file    Attends religious service: Not on file    Active member of club or organization: Not on file    Attends meetings of clubs or organizations: Not on file    Relationship status: Not on file  Other Topics Concern  . Not on file  Social History Narrative  . Not on file    Has this patient used any form of tobacco in the last 30  days? (Cigarettes, Smokeless Tobacco, Cigars, and/or Pipes) Prescription not provided because: Pt declined  Current Medications: Current Facility-Administered Medications  Medication Dose Route Frequency Provider Last Rate Last Dose  . acetaminophen (TYLENOL) tablet 1,000 mg  1,000 mg Oral Q6H PRN Raeford RazorKohut, Stephen, MD      . citalopram (CELEXA) tablet 20 mg  20 mg Oral Daily Raeford RazorKohut, Stephen, MD      . hydrOXYzine (VISTARIL) capsule 100 mg  100 mg Oral TID PRN Raeford RazorKohut, Stephen, MD      . prenatal multivitamin tablet 1 tablet  1 tablet Oral Daily Raeford RazorKohut, Stephen, MD       Current Outpatient Medications  Medication Sig Dispense Refill  . dicyclomine (BENTYL) 20 MG tablet Take 1 tablet (20 mg total) by mouth every 6 (six) hours as  needed for spasms. 12 tablet 0  . naproxen (NAPROSYN) 375 MG tablet Take 1 tablet (375 mg total) by mouth 2 (two) times daily as needed. 10 tablet 0  . ondansetron (ZOFRAN) 4 MG tablet Take 1 tablet (4 mg total) by mouth every 6 (six) hours as needed for nausea or vomiting. 12 tablet 0   PTA Medications:  (Not in a hospital admission)  Musculoskeletal: Strength & Muscle Tone: within normal limits Gait & Station: normal Patient leans: N/A  Psychiatric Specialty Exam: Physical Exam  Constitutional: She is oriented to person, place, and time. She appears well-developed and well-nourished.  HENT:  Head: Normocephalic.  Respiratory: Effort normal and breath sounds normal.  Musculoskeletal: Normal range of motion.  Neurological: She is alert and oriented to person, place, and time.    Review of Systems  Psychiatric/Behavioral: Positive for depression and substance abuse. Negative for hallucinations, memory loss and suicidal ideas. The patient is nervous/anxious. The patient does not have insomnia.   All other systems reviewed and are negative.   Blood pressure (!) 133/94, pulse 89, temperature 98.1 F (36.7 C), temperature source Oral, resp. rate 14, SpO2 98 %, unknown if currently breastfeeding.There is no height or weight on file to calculate BMI.  General Appearance: Casual  Eye Contact:  Good  Speech:  Clear and Coherent and Normal Rate  Volume:  Normal  Mood:  Anxious and Depressed  Affect:  Congruent and Depressed  Thought Process:  Coherent, Goal Directed, Linear and Descriptions of Associations: Intact  Orientation:  Full (Time, Place, and Person)  Thought Content:  Logical  Suicidal Thoughts:  No  Homicidal Thoughts:  No  Memory:  Immediate;   Good Recent;   Good Remote;   Fair  Judgement:  Poor  Insight:  Lacking  Psychomotor Activity:  Normal  Concentration:  Concentration: Good and Attention Span: Good  Recall:  Good  Fund of Knowledge:  Good  Language:  Good   Akathisia:  No  Handed:  Right  AIMS (if indicated):     Assets:  Communication Skills Desire for Improvement Social Support  ADL's:  Intact  Cognition:  WNL  Sleep:        Demographic Factors:  Adolescent or young adult, Caucasian, Low socioeconomic status and Unemployed  Loss Factors: Financial problems/change in socioeconomic status  Historical Factors: Impulsivity  Risk Reduction Factors:   Sense of responsibility to family and Positive social support  Continued Clinical Symptoms:  Depression:   Impulsivity Alcohol/Substance Abuse/Dependencies  Cognitive Features That Contribute To Risk:  Closed-mindedness    Suicide Risk:  Minimal: No identifiable suicidal ideation.  Patients presenting with no risk factors but with morbid ruminations; may be classified  as minimal risk based on the severity of the depressive symptoms    Plan Of Care/Follow-up recommendations:  Activity:  as tolerated Diet:  Heart healthy  Disposition: Discharge Home Follow up with [facility] for medication management, therapy, and drug treatment Avoid the use of alcohol and illicit drugs  Laveda Abbe, NP 03/24/2018, 9:57 AM  Patient seen face-to-face for psychiatric evaluation, chart reviewed and case discussed with the physician extender and developed treatment plan. Reviewed the information documented and agree with the treatment plan. Thedore Mins, MD

## 2018-03-24 NOTE — [facility] Counselor (Signed)
Clinician spoke to [place]obby, [place] and expressed pt was [facility], and another assessment is not needed. Pt's disposition is overnight observation/re-evaluation in the morning. Reita ClicheBobby, RN reported, he will relay the information to the pt's nurse.   Redmond Pullingreylese D Yona Kosek, MS, [facility], [facility] Triage Specialist [phone]

## 2018-03-24 NOTE — ED Notes (Signed)
wanded by security 

## 2018-03-24 NOTE — ED Provider Notes (Signed)
[facility]   CSN: [id number] Arrival date & time: 03/23/18  2222     History   Chief Complaint Chief Complaint  Patient presents with  . Psychiatric Evaluation    HPI Joice Loftsmber Luepke is a 32 y.o. female.  HPI   32 year old female who is presenting with withdrawal symptoms.  She states that she last used heroin, cocaine and drink alcohol little bit over a day ago.  See past history of drug abuse and states that she relapsed a few months ago.  She feels achy all over.  Nauseated.  Muscle aches.  No fevers.  No acute respiratory complaints. She feels depressed and hopeless. Denies any suicidal homicidal or ideation.  Denies any hallucinations.  History reviewed. No pertinent past medical history.  There are no active problems to display for this patient.   History reviewed. No pertinent surgical history.   OB History    Gravida  1   Para      Term      Preterm      AB      Living        SAB      TAB      Ectopic      Multiple      Live Births               Home Medications    Prior to Admission medications   Medication Sig Start Date End Date Taking? Authorizing Provider  acetaminophen (TYLENOL) 500 MG tablet Take 1,000 mg by mouth every 6 (six) hours as needed for mild pain.     [provider]  citalopram (CELEXA) 20 MG tablet Take 20 mg by mouth daily.    [provider]  hydrOXYzine (VISTARIL) 100 MG capsule Take 100 mg by mouth 3 (three) times daily as needed for itching.    [provider]  ibuprofen (ADVIL,MOTRIN) 200 MG tablet Take 200 mg by mouth every 6 (six) hours as needed. pain     [provider]  Prenatal Vit-Fe Fumarate-FA (MULTIVITAMIN-PRENATAL) 27-0.8 MG TABS Take 1 tablet by mouth daily.      [provider]  promethazine (PHENERGAN) 12.5 MG tablet Take 12.5 mg by mouth every 6 (six) hours as needed for nausea or vomiting.    [provider]  traZODone (DESYREL) 100 MG tablet Take 100 mg by mouth at bedtime as needed for sleep.    [provider]    Family History No family history on file.  Social History Social History   Tobacco Use  . Smoking status: Current Every Day Smoker    Packs/day: 1.00    Types: Cigarettes  . Smokeless tobacco: Never Used  Substance Use Topics  . Alcohol use: Yes  . Drug use: Yes    Types: Marijuana, Cocaine    Comment: opiates (last use 4-5 days ago 11/09/16)     Allergies   Penicillins   Review of Systems Review of Systems  All systems reviewed and negative, other than as noted in HPI.  Physical Exam Updated Vital Signs BP 113/90 (BP Location: Right Arm)   Pulse 64   Temp 98.1 F (36.7 C) (Oral)   Resp 18   LMP  (LMP Unknown)   SpO2 98%   Physical Exam  Constitutional: She appears well-developed and well-nourished. No distress.  HENT:  Head: Normocephalic and atraumatic.  Eyes: Conjunctivae are normal. Right eye exhibits no discharge. Left eye  exhibits no discharge.  Neck: Neck supple.  Cardiovascular: Normal rate, regular rhythm and normal heart sounds. Exam reveals no gallop and no friction rub.  No murmur heard. Pulmonary/Chest: Effort normal and breath sounds normal. No respiratory distress.  Abdominal: Soft. She exhibits no distension. There is no tenderness.  Musculoskeletal: She exhibits no edema or tenderness.  Neurological: She is alert.  Skin: Skin is warm and dry.  Psychiatric:  Speech clear. Content appropriate. Flat affect. Decent eye contact. Thought process is logical. Doesn't appear to be responding to internal stimuli.   Nursing note and vitals reviewed.    ED Treatments / Results  Labs (all labs ordered are listed, but only abnormal results are displayed) Labs Reviewed  COMPREHENSIVE METABOLIC PANEL - Abnormal; Notable for the following components:      Result Value   Potassium 3.4 (*)    All other components within  normal limits  ETHANOL  CBC  RAPID URINE DRUG SCREEN, HOSP PERFORMED  I-STAT BETA HCG BLOOD, ED ([facility], [facility], [facility] ONLY)    EKG None  Radiology No results found.  Procedures Procedures (including critical care time)  Medications Ordered in ED Medications  ondansetron (ZOFRAN-ODT) disintegrating tablet 4 mg (has no administration in time range)  naproxen (NAPROSYN) tablet 250 mg (has no administration in time range)  dicyclomine (BENTYL) capsule 10 mg (has no administration in time range)  cloNIDine (CATAPRES) tablet 0.1 mg (has no administration in time range)  citalopram (CELEXA) tablet 20 mg (has no administration in time range)  hydrOXYzine (VISTARIL) capsule 100 mg (has no administration in time range)  acetaminophen (TYLENOL) tablet 1,000 mg (has no administration in time range)  multivitamin-prenatal tablet 1 tablet (has no administration in time range)     Initial Impression / Assessment and Plan / ED Course  I have reviewed the triage vital signs and the nursing notes.  Pertinent labs & imaging results that were available during my care of the patient were reviewed by me and considered in my medical decision making (see chart for details).     31yF with polysubstance abuse requesting detox. Reporting symptoms typical of opiate withdrawal. She is not tachycardic, hypertensive nor tremulous. I doubt significant component of etoh withdrawal. Depressed but denies SI or HI. Will prescribe medications to take PRN for withdrawal. Outpt FU/resources.   Final Clinical Impressions(s) / ED Diagnoses   Final diagnoses:  Polysubstance abuse (HCC)  Depression, unspecified depression type    ED Discharge Orders    None       Raeford Razor, MD 03/25/18 1714

## 2018-03-24 NOTE — ED Notes (Signed)
All personal belongings accounted for and given back to patient at this time

## 2018-03-24 NOTE — ED Notes (Signed)
Meal tray delivered.

## 2018-03-24 NOTE — ED Notes (Signed)
Care handoff to Alexa RN
